# Patient Record
Sex: Male | Born: 1975 | Race: White | Hispanic: No | Smoking: Never smoker
Health system: Southern US, Community
[De-identification: ages and names within clinical notes are randomized; demographics above are authoritative.]

## PROBLEM LIST (undated history)

## (undated) DIAGNOSIS — I10 Essential (primary) hypertension: Secondary | ICD-10-CM

## (undated) DIAGNOSIS — M1990 Unspecified osteoarthritis, unspecified site: Secondary | ICD-10-CM

## (undated) DIAGNOSIS — F32A Depression, unspecified: Secondary | ICD-10-CM

## (undated) DIAGNOSIS — G44219 Episodic tension-type headache, not intractable: Secondary | ICD-10-CM

## (undated) DIAGNOSIS — D497 Neoplasm of unspecified behavior of endocrine glands and other parts of nervous system: Secondary | ICD-10-CM

## (undated) DIAGNOSIS — Z87442 Personal history of urinary calculi: Secondary | ICD-10-CM

## (undated) DIAGNOSIS — F329 Major depressive disorder, single episode, unspecified: Secondary | ICD-10-CM

## (undated) HISTORY — DX: Major depressive disorder, single episode, unspecified: F32.9

## (undated) HISTORY — DX: Personal history of urinary calculi: Z87.442

## (undated) HISTORY — DX: Depression, unspecified: F32.A

## (undated) HISTORY — DX: Neoplasm of unspecified behavior of endocrine glands and other parts of nervous system: D49.7

## (undated) HISTORY — DX: Episodic tension-type headache, not intractable: G44.219

## (undated) HISTORY — PX: ROTATOR CUFF REPAIR: SHX139

---

## 2004-11-09 ENCOUNTER — Ambulatory Visit: Payer: Self-pay | Admitting: Internal Medicine

## 2004-11-24 ENCOUNTER — Ambulatory Visit: Payer: Self-pay | Admitting: Internal Medicine

## 2005-04-26 ENCOUNTER — Emergency Department: Payer: Self-pay | Admitting: Internal Medicine

## 2007-03-14 ENCOUNTER — Ambulatory Visit: Payer: Self-pay | Admitting: Internal Medicine

## 2008-01-17 ENCOUNTER — Ambulatory Visit: Payer: Self-pay | Admitting: Internal Medicine

## 2008-03-22 ENCOUNTER — Emergency Department: Payer: Self-pay | Admitting: Emergency Medicine

## 2008-09-19 ENCOUNTER — Ambulatory Visit: Payer: Self-pay | Admitting: Internal Medicine

## 2008-09-23 ENCOUNTER — Inpatient Hospital Stay: Payer: Self-pay | Admitting: Urology

## 2008-12-05 HISTORY — PX: THYROID SURGERY: SHX805

## 2009-06-01 ENCOUNTER — Emergency Department (HOSPITAL_COMMUNITY): Admission: EM | Admit: 2009-06-01 | Discharge: 2009-06-02 | Payer: Self-pay | Admitting: Emergency Medicine

## 2011-03-14 LAB — COMPREHENSIVE METABOLIC PANEL
AST: 24 U/L (ref 0–37)
Albumin: 4 g/dL (ref 3.5–5.2)
Alkaline Phosphatase: 59 U/L (ref 39–117)
BUN: 9 mg/dL (ref 6–23)
Chloride: 106 mEq/L (ref 96–112)
Creatinine, Ser: 1.26 mg/dL (ref 0.4–1.5)
GFR calc Af Amer: >60 mL/min (ref >60)
Potassium: 3.4 mEq/L — ABNORMAL LOW (ref 3.5–5.1)
Total Bilirubin: 0.6 mg/dL (ref 0.3–1.2)
Total Protein: 6.8 g/dL (ref 6.0–8.3)

## 2011-03-14 LAB — DIFFERENTIAL
Basophils Absolute: 0 10*3/uL (ref 0.0–0.1)
Eosinophils Relative: 5 % (ref 0–5)
Lymphocytes Relative: 26 % (ref 12–46)
Monocytes Absolute: 0.8 10*3/uL (ref 0.1–1.0)
Monocytes Relative: 7 % (ref 3–12)
Neutro Abs: 6.7 10*3/uL (ref 1.7–7.7)

## 2011-03-14 LAB — CBC
Platelets: 248 10*3/uL (ref 150–400)
RDW: 13.1 % (ref 11.5–15.5)
WBC: 10.8 10*3/uL — ABNORMAL HIGH (ref 4.0–10.5)

## 2011-10-11 ENCOUNTER — Encounter: Payer: Self-pay | Admitting: Family

## 2011-10-11 ENCOUNTER — Ambulatory Visit: Payer: Self-pay | Admitting: Family

## 2011-10-11 ENCOUNTER — Ambulatory Visit (INDEPENDENT_AMBULATORY_CARE_PROVIDER_SITE_OTHER): Payer: Self-pay | Admitting: Family

## 2011-10-11 DIAGNOSIS — R51 Headache: Secondary | ICD-10-CM | POA: Insufficient documentation

## 2011-10-11 DIAGNOSIS — J45909 Unspecified asthma, uncomplicated: Secondary | ICD-10-CM | POA: Insufficient documentation

## 2011-10-11 DIAGNOSIS — E039 Hypothyroidism, unspecified: Secondary | ICD-10-CM | POA: Insufficient documentation

## 2011-10-11 DIAGNOSIS — R0789 Other chest pain: Secondary | ICD-10-CM

## 2011-10-11 DIAGNOSIS — D497 Neoplasm of unspecified behavior of endocrine glands and other parts of nervous system: Secondary | ICD-10-CM

## 2011-10-11 DIAGNOSIS — R519 Headache, unspecified: Secondary | ICD-10-CM | POA: Insufficient documentation

## 2011-10-11 DIAGNOSIS — F329 Major depressive disorder, single episode, unspecified: Secondary | ICD-10-CM | POA: Insufficient documentation

## 2011-10-11 NOTE — Assessment & Plan Note (Signed)
Stable at this time, generally worsens with seasonal allergies.  Pt using PRN albuterol.

## 2011-10-11 NOTE — Assessment & Plan Note (Addendum)
Benign per pt.  He reports removal of tumor. Will check serum calcium level. Will request records from [place].

## 2011-10-11 NOTE — Patient Instructions (Signed)
Please complete your blood work prior to leaving.  Follow up after January 1st as soon as your insurance is active.

## 2011-10-11 NOTE — Assessment & Plan Note (Signed)
This seems largely situational due to custody issues with his 34 yr old daughter.  Will monitor.

## 2011-10-11 NOTE — Assessment & Plan Note (Signed)
On synthroid.  Will check TSH.  Clinically euthyroid.

## 2011-10-11 NOTE — Assessment & Plan Note (Signed)
Resolved. ? Viral etiology.  EKG today is normal. Ear canals are irritated but pt tells me that he uses a lot of Q-tips.  I advised him to discontinue use of Q-tips.

## 2011-10-11 NOTE — Progress Notes (Signed)
Subjective:    Patient ID: Maxwell Coleman, male    DOB: Nov 25, 1976, 35 y.o.   MRN: [id number]  HPI  "Maxwell Coleman" is a 35 yr old male who presents today to establish care.  He reports that he is out of medications. He has been followed previously by Dr.  Worthy Keeler at the [facility].  He tells me that he is currently uninsured but that he   1. Hypothyroid- Notes that he had parathyroidectomy for a parathyriod tumor in 2010.  This was performed in [place].  Dr. Sharma Covert. He was started on levothyroxine about 10 yrs ago.    2. Asthma- reports that this is bad during the spring and fall.  Uses albuterol.    3. Depression-  Notes that this comes and goes.  Some custody issues with his daughter causing stress.  He reports that he is able to get motivated.  Notes that he does not sleep well.  He has taken ambien in the past.  Not able to get 8 hours of sleep.  Tosses and turns.  Appetite is fair.  But only eats 1 meal a day due to his work schedule. Weight is stable. Denies hopelessness or tearfulness.    4. Headaches- pt reports that he started getting some pressure on the front of his head.  Pressure went down to his chest. Denied cough or shortness of breath. Denies nasal congestion or sinus drainage. Denies ear pain. Legs got weak.  [facility] LOC[facility]   Review of Systems  Constitutional: Negative for unexpected weight change.  HENT: Negative for ear pain and congestion.   Eyes: Negative for visual disturbance.  Respiratory: Negative for cough.   Cardiovascular: Negative for leg swelling.  Gastrointestinal: Negative for nausea, vomiting and diarrhea.  Genitourinary: Negative for dysuria and frequency.  Musculoskeletal: Positive for arthralgias.  Neurological: Positive for headaches.  Hematological: Negative for adenopathy.  Psychiatric/Behavioral:       See HPI       Past Medical History  Diagnosis Date  . Asthma   . Depression   . Frequent episodic  tension-type headache   . History of kidney stones   . Parathyroid tumor     History   Social History  . Marital Status: Legally Separated    Spouse Name: N/A    Number of Children: 1  . Years of Education: N/A   Occupational History  .     Social History Main Topics  . Smoking status: Never Smoker   . Smokeless tobacco: Never Used  . Alcohol Use: Yes     Drinks on weekend  . Drug Use: Not on file  . Sexually Active: Not on file   Other Topics Concern  . Not on file   Social History Narrative   Caffeine use: 1-2 daily. 1 monster energy drink every other dayRegular exercise: 2 x weekly    Past Surgical History  Procedure Date  . Thyroid surgery 2010    removed para thyroid tumor    Family History  Problem Relation Age of Onset  . Thyroid disease Mother     Allergies  Allergen Reactions  . Morphine And Related Nausea Only    No current outpatient prescriptions on file prior to visit.    BP 126/92  Pulse 84  Temp(Src) 98.1 F (36.7 C) (Oral)  Resp 16  Ht 5\' 8"  (1.727 m)  Wt 198 lb 1.9 oz (89.867 kg)  BMI 30.12 kg/m2  Objective:   Physical Exam  Constitutional: He appears well-developed and well-nourished.  HENT:  Head: Atraumatic.       Erythema of bilateral canals.   Eyes: Conjunctivae are normal.  Neck: Neck supple. No thyromegaly present.  Cardiovascular: Normal rate and regular rhythm.   No murmur heard. Pulmonary/Chest: Effort normal and breath sounds normal. No respiratory distress. He has no wheezes. He has no rales. He exhibits no tenderness.  Abdominal: Soft. Bowel sounds are normal. He exhibits no distension and no mass. There is no tenderness. There is no rebound and no guarding.  Musculoskeletal: He exhibits no edema.  Lymphadenopathy:    He has no cervical adenopathy.  Skin: Skin is warm and dry. No erythema.  Psychiatric: He has a normal mood and affect. His behavior is normal. Judgment and thought content normal.           Assessment & Plan:

## 2011-10-12 ENCOUNTER — Telehealth: Payer: Self-pay | Admitting: Family

## 2011-10-12 LAB — CALCIUM: Calcium: 9.7 mg/dL (ref 8.4–10.5)

## 2011-10-12 MED ORDER — LEVOTHYROXINE SODIUM 125 MCG PO TABS: 125.0000 ug | ORAL_TABLET | Freq: Every day | ORAL | Status: DC

## 2011-10-12 NOTE — Telephone Encounter (Signed)
Pt.notified

## 2011-10-12 NOTE — Telephone Encounter (Signed)
Pls call pt and let him know that his calcium and thyroid test are normal.  I have sent refill on his thyroid medication to his pharmacy.

## 2012-07-03 ENCOUNTER — Telehealth: Payer: Self-pay | Admitting: Family

## 2012-07-03 MED ORDER — LEVOTHYROXINE SODIUM 125 MCG PO TABS: 125.0000 ug | ORAL_TABLET | Freq: Every day | ORAL | Status: DC

## 2012-07-03 NOTE — Telephone Encounter (Signed)
Levothyroxine refill sent to pharmacy [facility][id number] x no refills. Pt is past due for follow up. Please call pt to arrange appt as he will need to be seen before further refills can be given.

## 2012-10-04 ENCOUNTER — Ambulatory Visit: Payer: Self-pay | Admitting: Internal Medicine

## 2013-02-10 IMAGING — CT CT ABD-PELV W/O CM
1 of 2 series · 16 of 32 positions shown, 20 images · non-contrast
Comparison: none

REASON FOR EXAM: hematuria
COMMENTS:

PROCEDURE:     CT  - CT ABDOMEN AND PELVIS W[DATE] [DATE]
RESULT:
TECHNIQUE: Helical noncontrast 3 mm sections were obtained from the lung
bases through the pubic symphysis.

[Series 2: soft tissue · axial · 0.82mm/px · z∈[-1028,-590]mm · 16 of 160 slices shown, 20 images]
[im 7/160  soft-tissue]
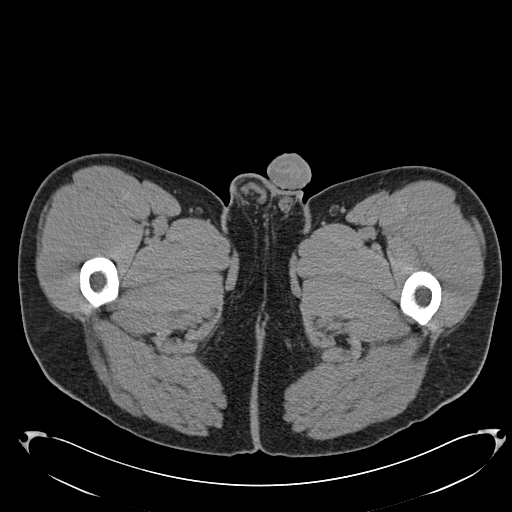
[im 7/160  bone]
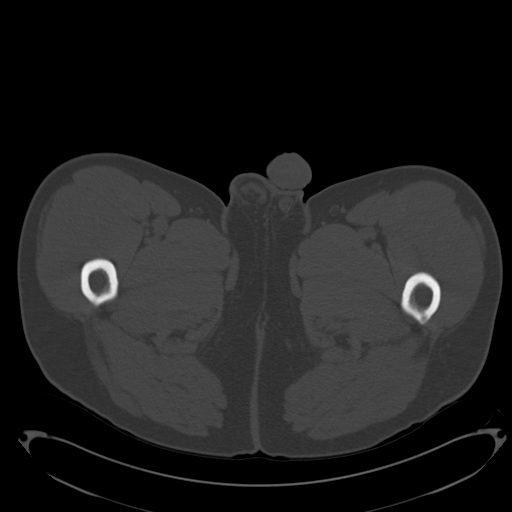
[im 20/160  soft-tissue]
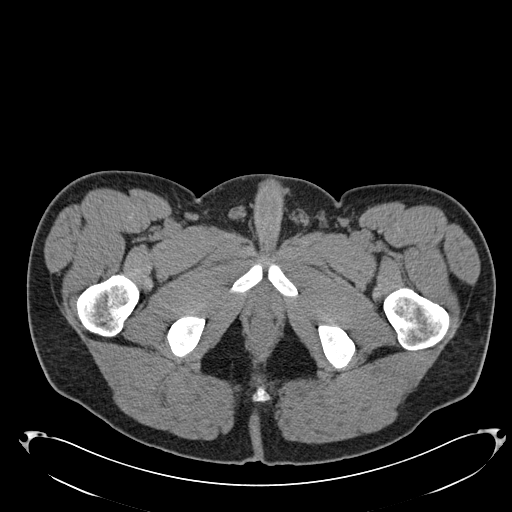
[im 34/160  soft-tissue]
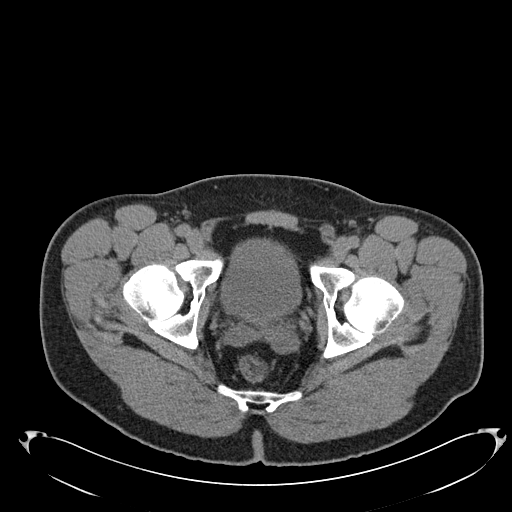
[im 40/160  soft-tissue]
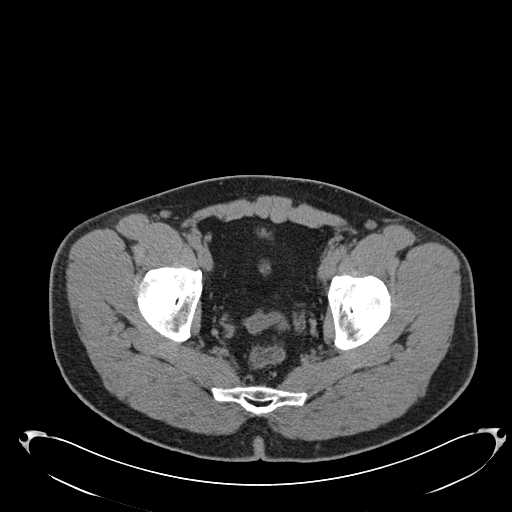
[im 54/160  soft-tissue]
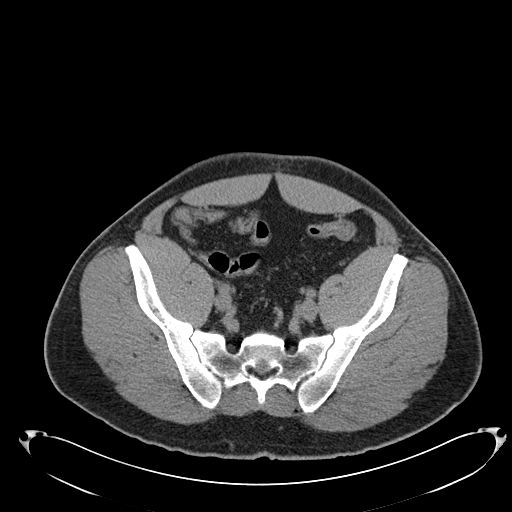
[im 67/160  soft-tissue]
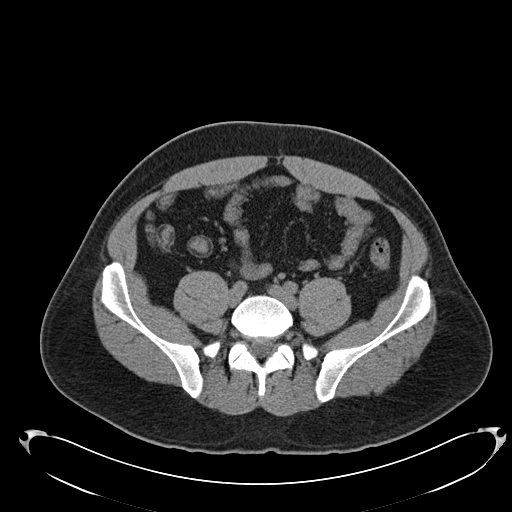
[im 73/160  soft-tissue]
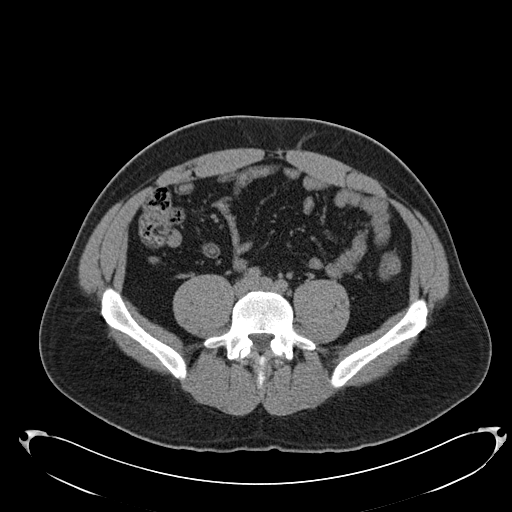
[im 87/160  soft-tissue]
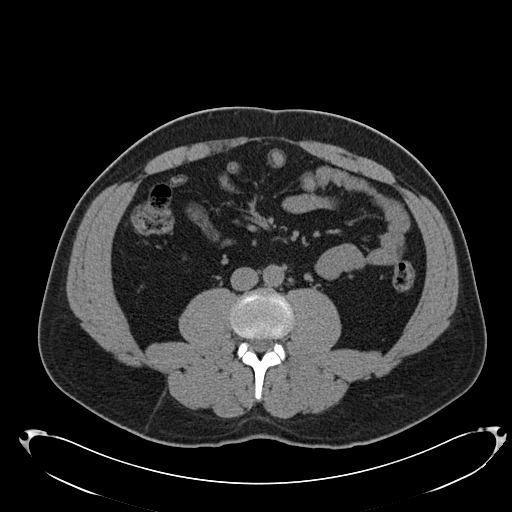
[im 93/160  soft-tissue]
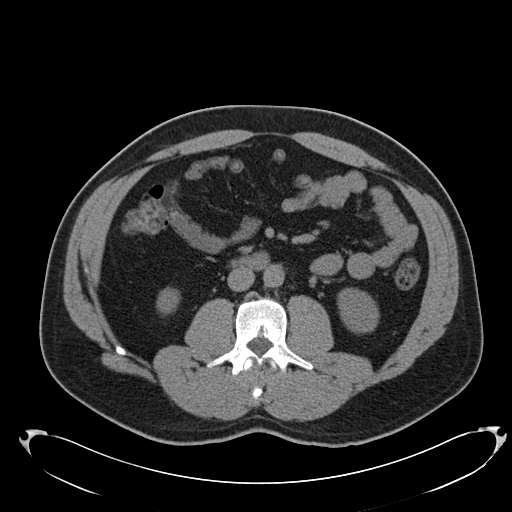
[im 93/160  bone]
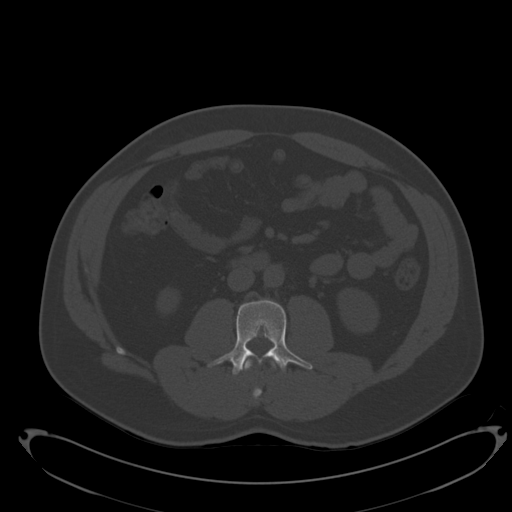
[im 107/160  soft-tissue]
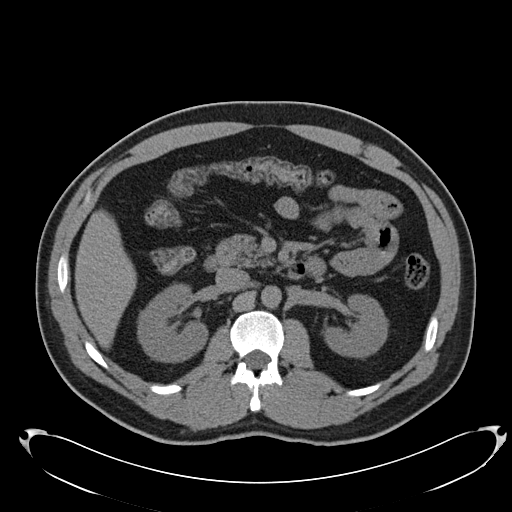
[im 120/160  soft-tissue]
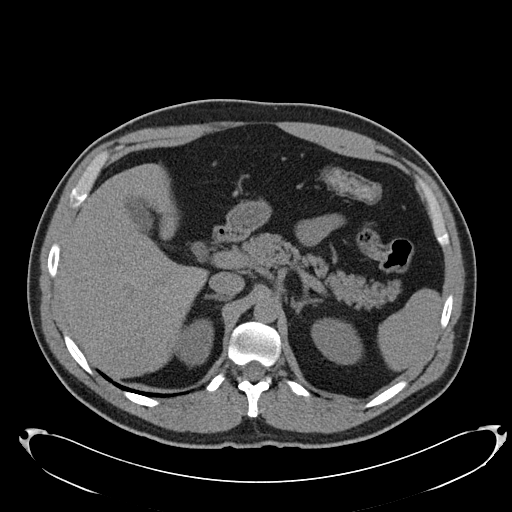
[im 126/160  soft-tissue]
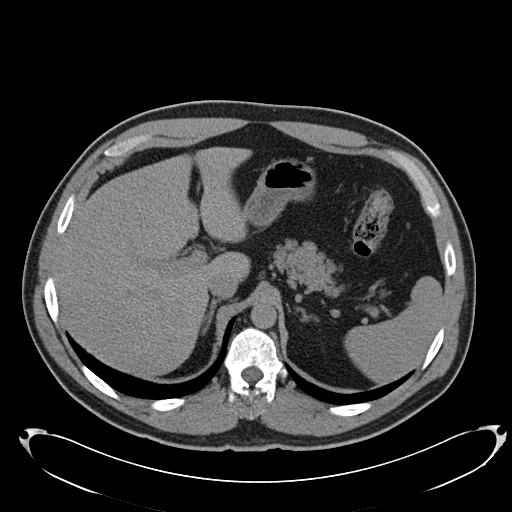
[im 133/160  lung]
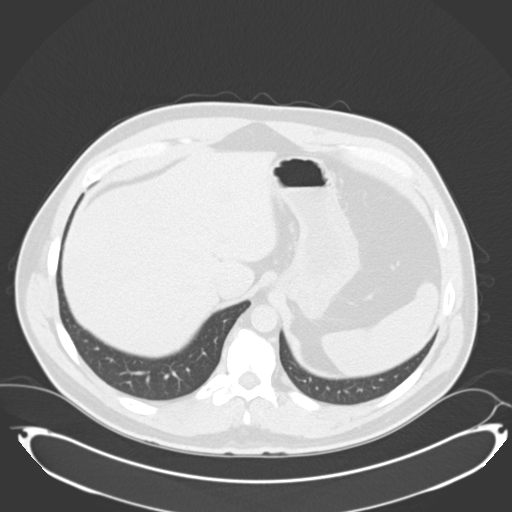
[im 140/160  soft-tissue]
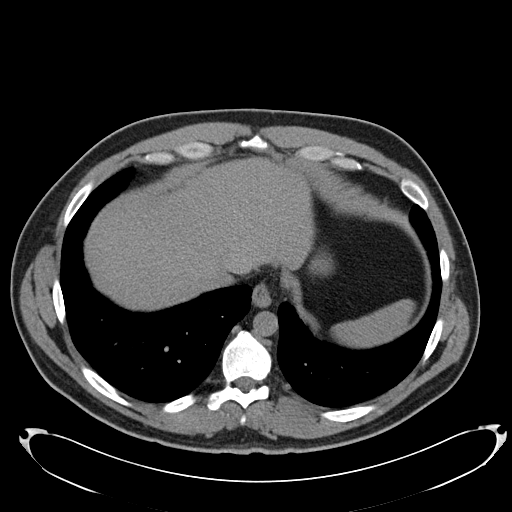
[im 140/160  lung]
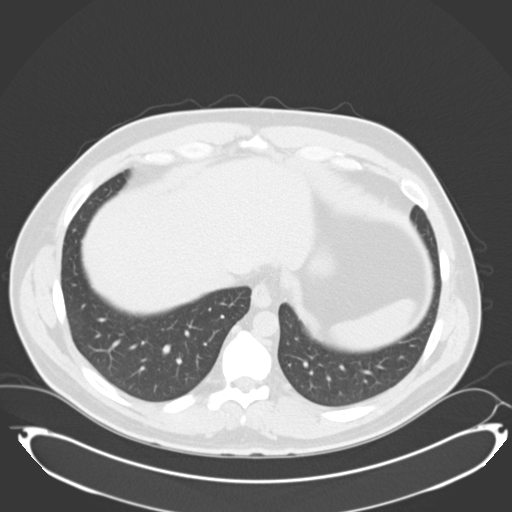
[im 146/160  lung]
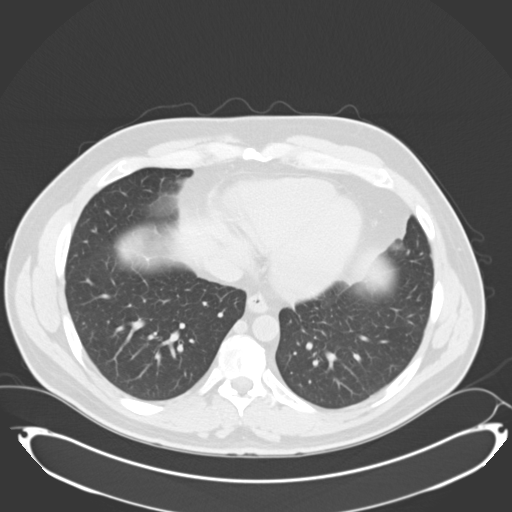
[im 153/160  soft-tissue]
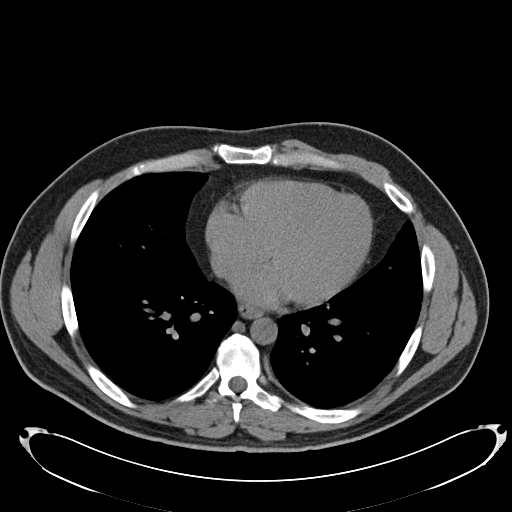
[im 153/160  lung]
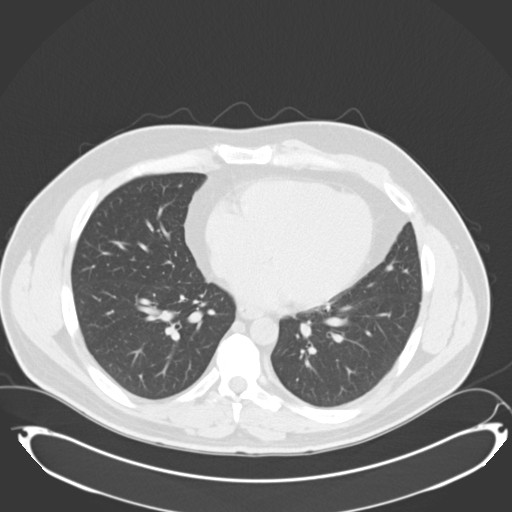

[16 of 32 positions shown; findings below may reference images not displayed]

FINDINGS: The lung bases are unremarkable.

Noncontrast evaluation of the liver, spleen, adrenals, and pancreas are
unremarkable. 1 to 2 mm size, nonobstructing medullary calculi are
identified within the kidneys. There is no evidence of hydronephrosis,
hydroureter, nephrolithiasis, or ureterolithiasis. There is no evidence of
bowel obstruction, enteritis, colitis, diverticulitis, or appendicitis
within the limitations of a noncontrast CT. There is no evidence of
abdominal aortic aneurysm.
IMPRESSION: No CT evidence of obstructive or inflammatory abnormalities
within the limitations of a noncontrast CT.

## 2020-09-16 ENCOUNTER — Ambulatory Visit
Admission: EM | Admit: 2020-09-16 | Discharge: 2020-09-16 | Disposition: A | Discharge status: Home / Self Care | Payer: BC Managed Care – PPO | Attending: Family Medicine | Admitting: Family Medicine

## 2020-09-16 ENCOUNTER — Other Ambulatory Visit: Payer: Self-pay

## 2020-09-16 DIAGNOSIS — J069 Acute upper respiratory infection, unspecified: Secondary | ICD-10-CM

## 2020-09-16 DIAGNOSIS — Z20822 Contact with and (suspected) exposure to covid-19: Secondary | ICD-10-CM

## 2020-09-16 MED ORDER — ALBUTEROL SULFATE HFA 108 (90 BASE) MCG/ACT IN AERS: 1.0000 | INHALATION_SPRAY | Freq: Four times a day (QID) | RESPIRATORY_TRACT | 1 refills | Status: AC | PRN

## 2020-09-16 NOTE — ED Triage Notes (Signed)
Pt c/o cough, sore throat, and body aches x2days. States need covid testing for work.

## 2020-09-16 NOTE — ED Provider Notes (Signed)
[facility]    CSN: [id number] Arrival date & time: 09/16/20  1204      History   Chief Complaint Chief Complaint  Patient presents with  . Cough    HPI Maxwell Coleman is a 44 y.o. male.   Patient has some cough and congestion.  No fever cough is nonproductive.  No GI symptoms.  No loss of taste or smell.  His work is requested he be tested for Covid before returning  HPI  Past Medical History:  Diagnosis Date  . Asthma   . Depression   . Frequent episodic tension-type headache   . History of kidney stones   . Parathyroid tumor     Patient Active Problem List   Diagnosis Date Noted  . Hypothyroid 10/11/2011  . Headache(784.0) 10/11/2011  . Depression 10/11/2011  . Parathyroid tumor 10/11/2011  . Asthma 10/11/2011    Past Surgical History:  Procedure Laterality Date  . THYROID SURGERY  2010   removed para thyroid tumor       Home Medications    Prior to Admission medications   Not on File    Family History Family History  Problem Relation Age of Onset  . Thyroid disease Mother     Social History Social History   Tobacco Use  . Smoking status: Never Smoker  . Smokeless tobacco: Never Used  Substance Use Topics  . Alcohol use: Not Currently    Comment: Drinks on weekend  . Drug use: Not Currently     Allergies   Morphine and related   Review of Systems Review of Systems  Respiratory: Positive for cough.   All other systems reviewed and are negative.    Physical Exam Triage Vital Signs ED Triage Vitals [09/16/20 1213]  Enc Vitals Group     BP (!) 149/95     Pulse Rate 81     Resp 18     Temp 98.1 F (36.7 C)     Temp Source Oral     SpO2 96 %     Weight      Height      Head Circumference      Peak Flow      Pain Score 4     Pain Loc      Pain Edu?      Excl. in [place]?    No data found.  Updated Vital Signs BP (!) 149/95 (BP Location: Left Arm)   Pulse 81   Temp 98.1 F (36.7 C) (Oral)   Resp 18    SpO2 96%   Visual Acuity Right Eye Distance:   Left Eye Distance:   Bilateral Distance:    Right Eye Near:   Left Eye Near:    Bilateral Near:     Physical Exam Vitals and nursing note reviewed.  Constitutional:      Appearance: Normal appearance.  HENT:     Head: Normocephalic.     Left Ear: Tympanic membrane normal.     Mouth/Throat:     Mouth: Mucous membranes are moist.  Cardiovascular:     Rate and Rhythm: Normal rate and regular rhythm.  Pulmonary:     Effort: Pulmonary effort is normal.     Breath sounds: Normal breath sounds.  Neurological:     General: No focal deficit present.     Mental Status: He is alert and oriented to person, place, and time.      UC Treatments / Results  Labs (all  labs ordered are listed, but only abnormal results are displayed) Labs Reviewed  NOVEL CORONAVIRUS, NAA    EKG   Radiology No results found.  Procedures Procedures (including critical care time)  Medications Ordered in UC Medications - No data to display  Initial Impression / Assessment and Plan / UC Course  I have reviewed the triage vital signs and the nursing notes.  Pertinent labs & imaging results that were available during my care of the patient were reviewed by me and considered in my medical decision making (see chart for details).     Upper respiratory infection.  Patient has history of asthma and has had some wheezing although I do not hear any rhonchi or wheezes today.  Will provide Rx for albuterol to take as needed Final Clinical Impressions(s) / UC Diagnoses   Final diagnoses:  Encounter for screening laboratory testing for COVID-19 virus   Discharge Instructions   None    ED Prescriptions    None     PDMP not reviewed this encounter.   Wardell Honour, MD 09/16/20 1228

## 2020-09-17 LAB — SARS-COV-2, NAA 2 DAY TAT

## 2020-09-17 LAB — NOVEL CORONAVIRUS, NAA: SARS-CoV-2, NAA: NOT DETECTED

## 2023-06-14 ENCOUNTER — Other Ambulatory Visit: Payer: Self-pay | Admitting: Orthopedic Surgery

## 2023-07-24 ENCOUNTER — Ambulatory Visit (HOSPITAL_BASED_OUTPATIENT_CLINIC_OR_DEPARTMENT_OTHER)
Admission: RE | Admit: 2023-07-24 | Payer: BC Managed Care – PPO | Source: Home / Self Care | Admitting: Orthopedic Surgery

## 2023-07-24 ENCOUNTER — Encounter (HOSPITAL_BASED_OUTPATIENT_CLINIC_OR_DEPARTMENT_OTHER): Admission: RE | Payer: Self-pay | Source: Home / Self Care

## 2023-07-24 SURGERY — CYST REMOVAL
Anesthesia: Choice | Laterality: Left

## 2023-11-23 ENCOUNTER — Other Ambulatory Visit: Payer: Self-pay

## 2023-11-23 DIAGNOSIS — R404 Transient alteration of awareness: Secondary | ICD-10-CM

## 2023-11-27 ENCOUNTER — Ambulatory Visit: Payer: BC Managed Care – PPO | Admitting: Neurology

## 2023-11-27 DIAGNOSIS — R404 Transient alteration of awareness: Secondary | ICD-10-CM | POA: Diagnosis not present

## 2023-12-07 ENCOUNTER — Telehealth: Payer: Self-pay | Admitting: Neurology

## 2023-12-07 NOTE — Telephone Encounter (Signed)
 Patient had a EEG done on 11-27-23 and needs to make sure the results are sent to  [facility]  by 12-21-23. He has appt with them that day    They are the referring provide

## 2023-12-08 NOTE — Telephone Encounter (Signed)
 Faxed. Called patient and informed him that results has been faxed as requested.

## 2023-12-08 NOTE — Telephone Encounter (Signed)
 Printed out report and referral, ls fax to Dr. Rolanda Lundborg

## 2023-12-29 ENCOUNTER — Other Ambulatory Visit: Payer: Self-pay | Admitting: Orthopedic Surgery

## 2024-01-19 ENCOUNTER — Encounter (HOSPITAL_BASED_OUTPATIENT_CLINIC_OR_DEPARTMENT_OTHER): Payer: Self-pay | Admitting: Orthopedic Surgery

## 2024-01-19 ENCOUNTER — Other Ambulatory Visit: Payer: Self-pay

## 2024-01-26 ENCOUNTER — Ambulatory Visit (HOSPITAL_BASED_OUTPATIENT_CLINIC_OR_DEPARTMENT_OTHER)
Admission: RE | Admit: 2024-01-26 | Payer: BC Managed Care – PPO | Source: Home / Self Care | Admitting: Orthopedic Surgery

## 2024-01-26 ENCOUNTER — Encounter (HOSPITAL_BASED_OUTPATIENT_CLINIC_OR_DEPARTMENT_OTHER): Admission: RE | Payer: Self-pay | Source: Home / Self Care

## 2024-01-26 HISTORY — DX: Essential (primary) hypertension: I10

## 2024-01-26 HISTORY — DX: Unspecified osteoarthritis, unspecified site: M19.90

## 2024-01-26 SURGERY — CYST REMOVAL
Anesthesia: Choice | Site: Thumb | Laterality: Left
